# Patient Record
Sex: Female | Born: 1970 | Hispanic: Yes | Marital: Married
Health system: Southern US, Community
[De-identification: ages and names within clinical notes are randomized; demographics above are authoritative.]

---

## 2004-11-06 ENCOUNTER — Ambulatory Visit: Payer: Self-pay

## 2004-11-20 ENCOUNTER — Ambulatory Visit: Payer: Self-pay

## 2004-11-29 ENCOUNTER — Ambulatory Visit: Payer: Self-pay

## 2006-09-20 ENCOUNTER — Ambulatory Visit: Payer: Self-pay

## 2006-10-18 ENCOUNTER — Ambulatory Visit: Payer: Self-pay

## 2007-01-26 ENCOUNTER — Ambulatory Visit: Payer: Self-pay

## 2009-07-11 ENCOUNTER — Emergency Department: Payer: Self-pay | Admitting: Emergency Medicine

## 2010-02-17 ENCOUNTER — Encounter: Payer: Self-pay | Admitting: Maternal & Fetal Medicine

## 2010-03-27 ENCOUNTER — Encounter: Payer: Self-pay | Admitting: Obstetrics & Gynecology

## 2010-03-31 ENCOUNTER — Encounter: Payer: Self-pay | Admitting: Obstetrics and Gynecology

## 2010-04-17 ENCOUNTER — Encounter: Payer: Self-pay | Admitting: Obstetrics and Gynecology

## 2012-03-22 ENCOUNTER — Emergency Department: Payer: Self-pay | Admitting: *Deleted

## 2012-03-22 LAB — URINALYSIS, COMPLETE
Bacteria: NONE SEEN
Glucose,UR: NEGATIVE mg/dL (ref 0–75)
Nitrite: NEGATIVE
Ph: 5 (ref 4.5–8.0)
RBC,UR: 209 /HPF (ref 0–5)
Squamous Epithelial: 2

## 2012-03-22 LAB — COMPREHENSIVE METABOLIC PANEL
Albumin: 3.7 g/dL (ref 3.4–5.0)
Anion Gap: 8 (ref 7–16)
Calcium, Total: 8.4 mg/dL — ABNORMAL LOW (ref 8.5–10.1)
Co2: 27 mmol/L (ref 21–32)
Creatinine: 0.7 mg/dL (ref 0.60–1.30)
EGFR (Non-African Amer.): >60
Glucose: 131 mg/dL — ABNORMAL HIGH (ref 65–99)
Osmolality: 279 (ref 275–301)
Sodium: 139 mmol/L (ref 136–145)

## 2012-03-22 LAB — PREGNANCY, URINE: Pregnancy Test, Urine: NEGATIVE m[IU]/mL

## 2012-03-22 LAB — CBC
MCH: 25.9 pg — ABNORMAL LOW (ref 26.0–34.0)
MCHC: 31.8 g/dL — ABNORMAL LOW (ref 32.0–36.0)
Platelet: 321 10*3/uL (ref 150–440)
RBC: 5.03 10*6/uL (ref 3.80–5.20)
RDW: 14.1 % (ref 11.5–14.5)

## 2017-11-18 DIAGNOSIS — M545 Low back pain: Secondary | ICD-10-CM | POA: Insufficient documentation

## 2017-11-19 ENCOUNTER — Other Ambulatory Visit: Payer: Self-pay

## 2017-11-19 ENCOUNTER — Emergency Department
Admission: EM | Admit: 2017-11-19 | Discharge: 2017-11-19 | Disposition: A | Discharge status: Home / Self Care | Payer: Self-pay | Attending: Emergency Medicine | Admitting: Emergency Medicine

## 2017-11-19 DIAGNOSIS — M545 Low back pain, unspecified: Secondary | ICD-10-CM

## 2017-11-19 MED ORDER — IBUPROFEN 600 MG PO TABS
600.0000 mg | ORAL_TABLET | Freq: Once | ORAL | Status: AC
  Administered 2017-11-19: 600 mg via ORAL
  Filled 2017-11-19: qty 1
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
## 2019-06-09 ENCOUNTER — Telehealth: Payer: Self-pay

## 2019-06-09 NOTE — Telephone Encounter (Signed)
Pre-visit screening call attempted prior to [facility] appointment on 06/14/2019. Interpreter: Maritza - left msg for call back.

## 2019-06-13 ENCOUNTER — Other Ambulatory Visit: Payer: Self-pay

## 2019-06-14 ENCOUNTER — Other Ambulatory Visit: Payer: Self-pay

## 2019-06-14 ENCOUNTER — Ambulatory Visit: Payer: Self-pay | Attending: Oncology | Admitting: *Deleted

## 2019-06-14 VITALS — BP 103/64 | HR 60 | Temp 97.7°F | Ht 66.0 in | Wt 191.6 lb

## 2019-06-14 DIAGNOSIS — N63 Unspecified lump in unspecified breast: Secondary | ICD-10-CM

## 2019-06-14 NOTE — Patient Instructions (Signed)
Gave patient hand-out, Women Staying Healthy, Active and Well from [facility], with education on breast health, pap smears, heart and colon health. 

## 2019-06-14 NOTE — Progress Notes (Signed)
  Subjective:     Patient ID: Joan Bradley, female   DOB: [birth year], 48 y.o.   MRN: [id number]  HPI   Review of Systems     Objective:   Physical Exam Chest:     Breasts:        Right: No swelling, bleeding, inverted nipple, mass, nipple discharge, skin change or tenderness.        Left: Mass present. No swelling, bleeding, inverted nipple, nipple discharge, skin change or tenderness.    Lymphadenopathy:     Upper Body:     Right upper body: No supraclavicular or axillary adenopathy.     Left upper body: No supraclavicular or axillary adenopathy.        Assessment:     48 year old Hispanic female referred to [facility] by Joan Finner, NP at the [facility] for further evaluation of a left breast mass.  Joan Bradley, the interpreter present during the interview and exam.  Clinical breast exam reveals an approximate 0.5 cm nodule at 1:00 at the edge of the areola.  Taught self breast awareness. Last pap on 05/24/19 was negative / no HPV co-testing.  Next pap due in 2023.  Patient has been screened for eligibility.  She does not have any insurance, Medicare or Medicaid.  She also meets financial eligibility.  Hand-out given on the Affordable Care Act. Risk Assessment    Risk Scores      06/14/2019   Last edited by: Theodore Demark, RN   5-year risk: 0.9 %   Lifetime risk: 9 %            Plan:      Bilateral diagnostic mammogram ordered and scheduled for 06/22/19 @ 2:20.  Will follow up per [facility] protocol.

## 2019-06-22 ENCOUNTER — Ambulatory Visit
Admission: RE | Admit: 2019-06-22 | Discharge: 2019-06-22 | Disposition: A | Discharge status: Home / Self Care | Payer: Self-pay | Source: Ambulatory Visit | Attending: Oncology | Admitting: Oncology

## 2019-06-22 DIAGNOSIS — N63 Unspecified lump in unspecified breast: Secondary | ICD-10-CM

## 2019-06-27 ENCOUNTER — Ambulatory Visit: Payer: Self-pay

## 2019-07-04 ENCOUNTER — Other Ambulatory Visit: Payer: Self-pay | Admitting: *Deleted

## 2019-07-04 DIAGNOSIS — N63 Unspecified lump in unspecified breast: Secondary | ICD-10-CM

## 2019-08-10 ENCOUNTER — Encounter: Payer: Self-pay | Admitting: *Deleted

## 2019-08-10 NOTE — Progress Notes (Signed)
Letter mailed to inform patient of her next mammogram / ultrasound appointment on 12/25/19 @ 10:40.

## 2019-12-25 ENCOUNTER — Inpatient Hospital Stay: Admission: RE | Admit: 2019-12-25 | Payer: Self-pay | Source: Ambulatory Visit

## 2020-01-15 IMAGING — MG DIGITAL DIAGNOSTIC BILAT W/ TOMO W/ CAD
6 of 12 series · 6 of 36 positions shown · non-contrast
Comparison: None

CLINICAL DATA: 48-year-old female with palpable lump in the UPPER
OUTER LEFT breast discovered on clinical examination. Baseline
mammograms.

EXAM:
DIGITAL DIAGNOSTIC BILATERAL MAMMOGRAM WITH CAD AND TOMO
ULTRASOUND BILATERAL BREAST

[L CC synth-2D (1 of 2)]
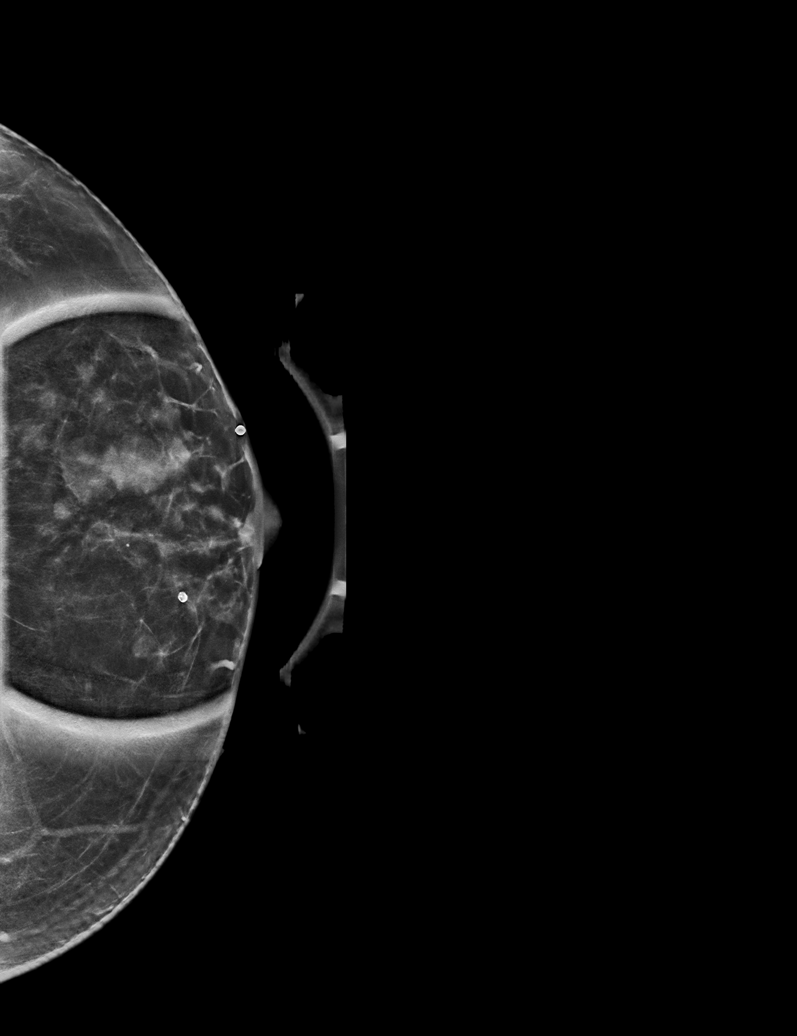

[L CC synth-2D (2 of 2)]
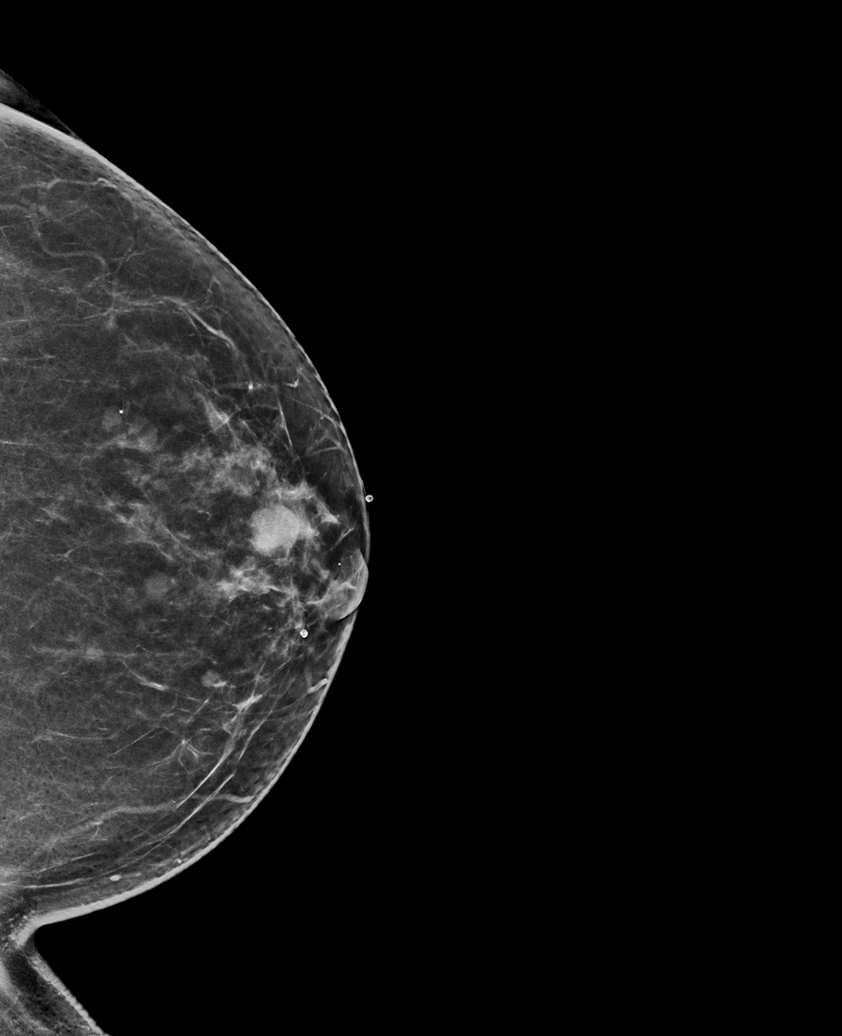

[L MLO synth-2D]
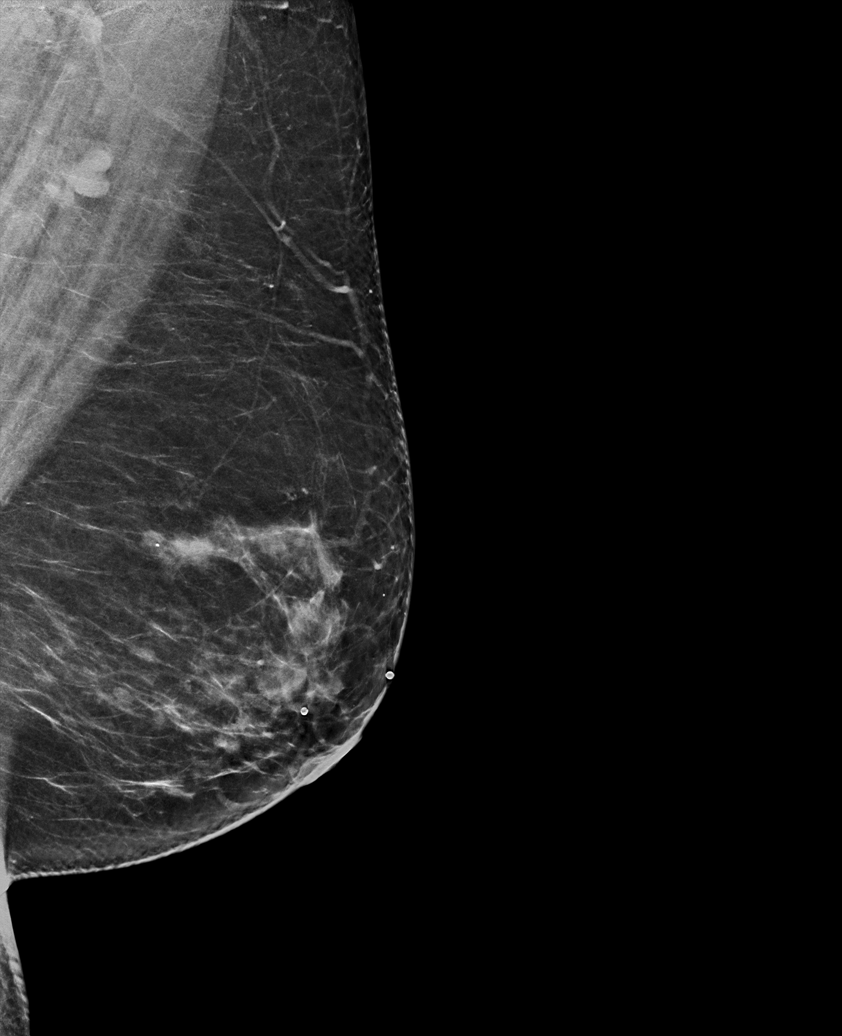

[R CC synth-2D]
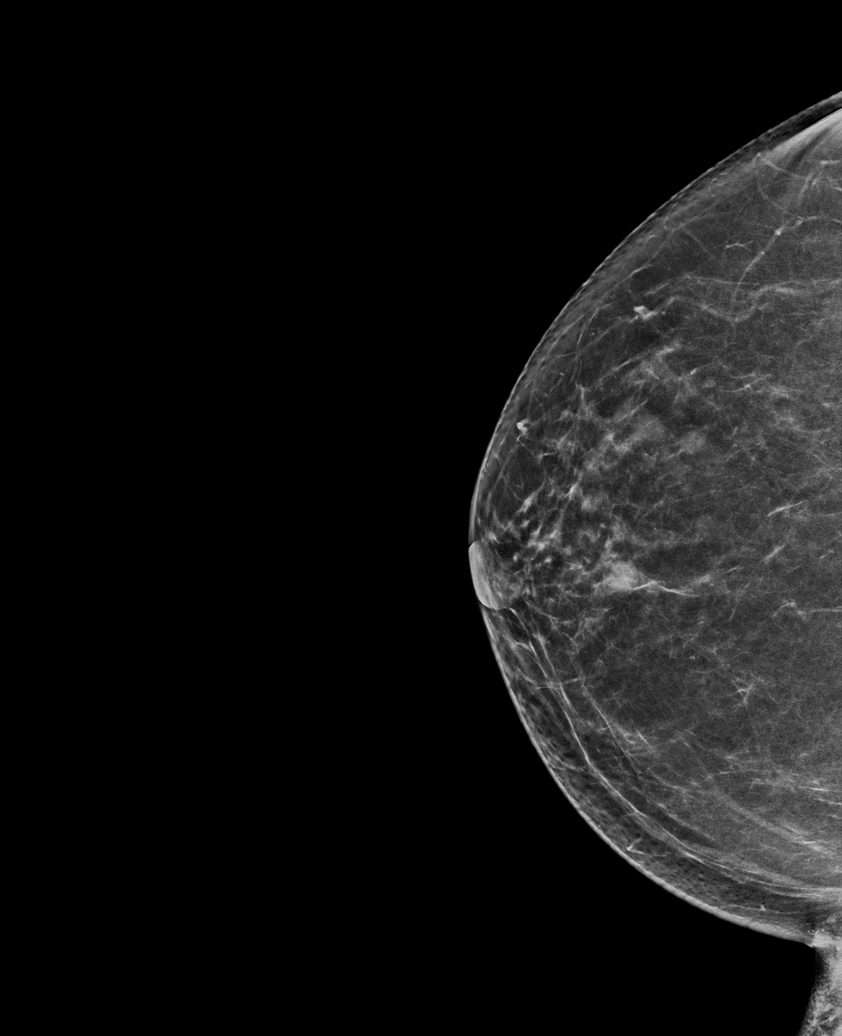

[R MLO synth-2D]
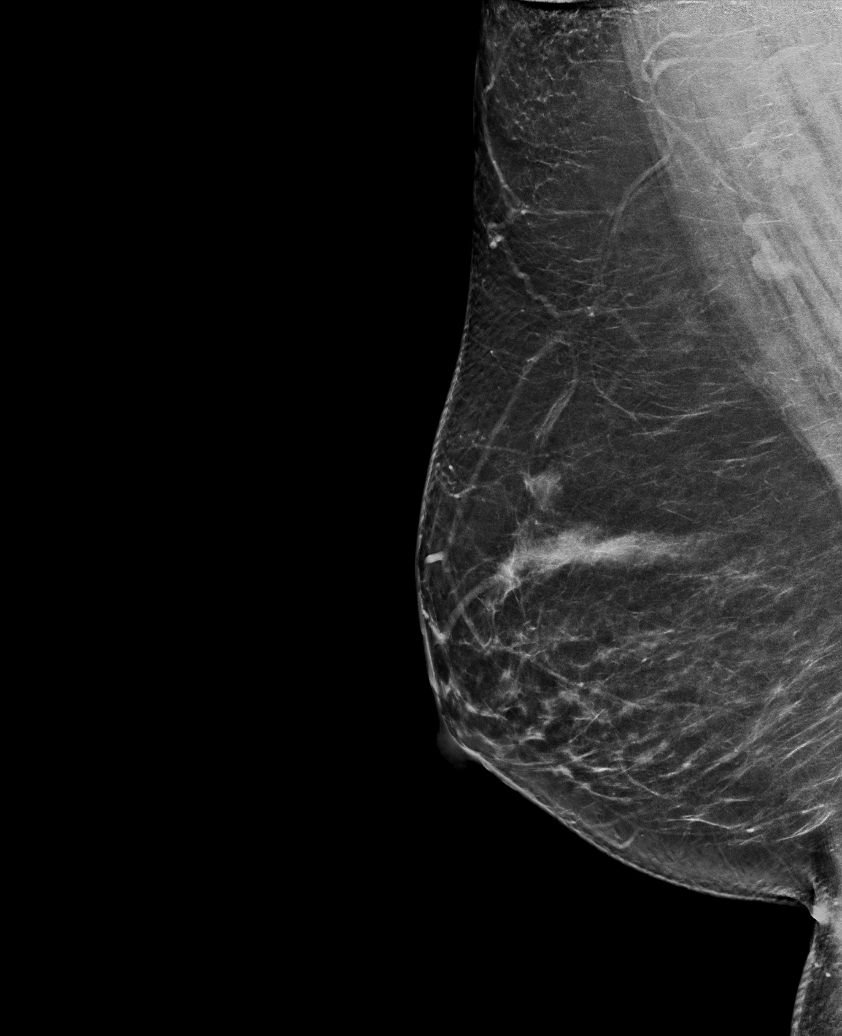

[L TAN synth-2D]
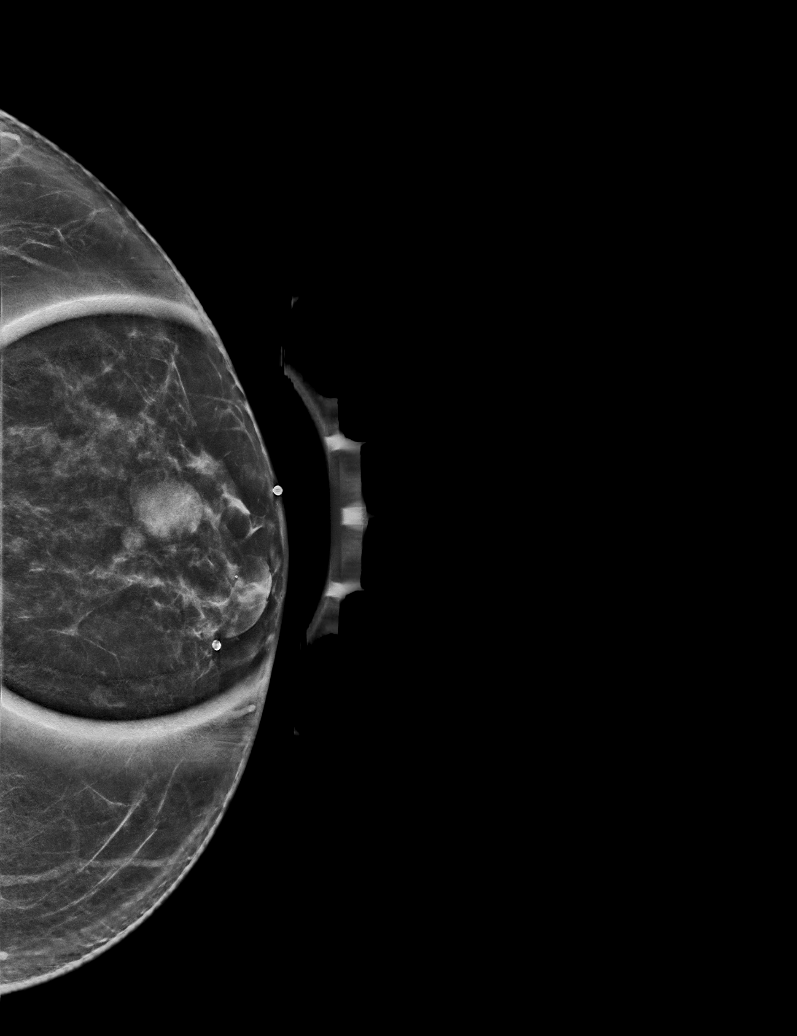

[6 of 36 positions shown; findings below may reference images not displayed]

ACR Breast Density Category b: There are scattered areas of
fibroglandular density.
FINDINGS: 2D/3D full field views of both breasts and a spot compression view
of the LEFT breast are performed.

A circumscribed oval mass within the UPPER RIGHT breast is
identified.

Several circumscribed oval masses throughout the LEFT breast
identified, including a 1.5 cm mass in the UPPER-OUTER LEFT breast
underlying the BB placed at the site of palpable concern.

No suspicious mass, distortion or worrisome calcifications are noted
within either breast.

Mammographic images were processed with CAD.

Targeted ultrasound is performed, showing a 1.1 x 0.7 x 0.9 cm
complicated cystic structure/cyst at the 12 o'clock position of the
RIGHT breast 5 cm from the nipple corresponding to the mammographic
mass. No internal vascular flow is noted.

A 1.2 x 1.2 x 1.5 cm benign simple cyst at the 1 o'clock position of
the LEFT breast 1 cm from the nipple corresponds to the patient's
palpable abnormality. Other small simple cysts scattered within the
LEFT breast identified.
IMPRESSION: 1. 1.1 cm probable complicated cyst in the UPPER RIGHT breast.
Six-month follow-up recommended to ensure stability.
2. 1.5 cm benign simple cyst within the UPPER-OUTER LEFT breast
corresponding to the patient's palpable abnormality.
3. Scattered small cysts within the LEFT breast.

RECOMMENDATION:
RIGHT breast ultrasound in 6 months.

I have discussed the findings and recommendations with the patient.
If applicable, a reminder letter will be sent to the patient
regarding the next appointment.

BI-RADS CATEGORY  3: Probably benign.

## 2020-02-07 ENCOUNTER — Other Ambulatory Visit: Payer: Self-pay
